# Patient Record
Sex: Male | Born: 1995 | Hispanic: No | Marital: Single | Smoking: Never smoker
Health system: Southern US, Community
[De-identification: ages and names within clinical notes are randomized; demographics above are authoritative.]

---

## 2016-01-18 ENCOUNTER — Emergency Department (HOSPITAL_COMMUNITY): Payer: No Typology Code available for payment source

## 2016-01-18 ENCOUNTER — Encounter (HOSPITAL_COMMUNITY): Payer: Self-pay | Admitting: *Deleted

## 2016-01-18 ENCOUNTER — Emergency Department (HOSPITAL_COMMUNITY)
Admission: EM | Admit: 2016-01-18 | Discharge: 2016-01-18 | Disposition: A | Discharge status: Home / Self Care | Payer: No Typology Code available for payment source | Attending: Emergency Medicine | Admitting: Emergency Medicine

## 2016-01-18 DIAGNOSIS — S4992XA Unspecified injury of left shoulder and upper arm, initial encounter: Secondary | ICD-10-CM | POA: Diagnosis present

## 2016-01-18 DIAGNOSIS — Y92410 Unspecified street and highway as the place of occurrence of the external cause: Secondary | ICD-10-CM | POA: Insufficient documentation

## 2016-01-18 DIAGNOSIS — Y9389 Activity, other specified: Secondary | ICD-10-CM | POA: Diagnosis not present

## 2016-01-18 DIAGNOSIS — S46912A Strain of unspecified muscle, fascia and tendon at shoulder and upper arm level, left arm, initial encounter: Secondary | ICD-10-CM | POA: Diagnosis not present

## 2016-01-18 DIAGNOSIS — S40812A Abrasion of left upper arm, initial encounter: Secondary | ICD-10-CM | POA: Insufficient documentation

## 2016-01-18 DIAGNOSIS — Y998 Other external cause status: Secondary | ICD-10-CM | POA: Diagnosis not present

## 2016-01-18 MED ORDER — IBUPROFEN 800 MG PO TABS
800.0000 mg | ORAL_TABLET | Freq: Once | ORAL | Status: AC
  Administered 2016-01-18: 800 mg via ORAL
  Filled 2016-01-18: qty 1

## 2016-01-18 MED ORDER — CYCLOBENZAPRINE HCL 10 MG PO TABS: 10.0000 mg | ORAL_TABLET | Freq: Two times a day (BID) | ORAL | Status: AC | PRN

## 2016-01-18 MED ORDER — IBUPROFEN 600 MG PO TABS: 600.0000 mg | ORAL_TABLET | Freq: Four times a day (QID) | ORAL | Status: AC | PRN
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
## 2016-01-18 NOTE — ED Notes (Signed)
Pt to xray
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
